# Patient Record
Sex: Male | Born: 1970 | Race: Black or African American | Hispanic: No | Marital: Single | Smoking: Current every day smoker
Health system: Southern US, Community
[De-identification: ages and names within clinical notes are randomized; demographics above are authoritative.]

## PROBLEM LIST (undated history)

## (undated) ENCOUNTER — Emergency Department (HOSPITAL_COMMUNITY): Admission: EM | Payer: PRIVATE HEALTH INSURANCE | Source: Home / Self Care

## (undated) DIAGNOSIS — I10 Essential (primary) hypertension: Secondary | ICD-10-CM

## (undated) SURGERY — Surgical Case
Anesthesia: *Unknown

---

## 2010-06-19 ENCOUNTER — Emergency Department (HOSPITAL_COMMUNITY)
Admission: EM | Admit: 2010-06-19 | Discharge: 2010-06-19 | Disposition: A | Discharge status: Home / Self Care | Payer: PRIVATE HEALTH INSURANCE | Attending: Emergency Medicine | Admitting: Emergency Medicine

## 2010-06-19 ENCOUNTER — Emergency Department (HOSPITAL_COMMUNITY): Payer: PRIVATE HEALTH INSURANCE

## 2010-06-19 DIAGNOSIS — R51 Headache: Secondary | ICD-10-CM | POA: Insufficient documentation

## 2010-06-19 DIAGNOSIS — R110 Nausea: Secondary | ICD-10-CM | POA: Insufficient documentation

## 2010-06-19 DIAGNOSIS — M79609 Pain in unspecified limb: Secondary | ICD-10-CM | POA: Insufficient documentation

## 2010-06-19 DIAGNOSIS — R0789 Other chest pain: Secondary | ICD-10-CM | POA: Insufficient documentation

## 2010-06-19 DIAGNOSIS — M25519 Pain in unspecified shoulder: Secondary | ICD-10-CM | POA: Insufficient documentation

## 2010-06-19 DIAGNOSIS — R002 Palpitations: Secondary | ICD-10-CM | POA: Insufficient documentation

## 2010-06-19 LAB — POCT CARDIAC MARKERS
CKMB, poc: <1 ng/mL — ABNORMAL LOW (ref 1.0–8.0)
Myoglobin, poc: 111 ng/mL (ref 12–200)

## 2010-06-19 LAB — COMPREHENSIVE METABOLIC PANEL
AST: 42 U/L — ABNORMAL HIGH (ref 0–37)
BUN: 8 mg/dL (ref 6–23)
CO2: 24 mEq/L (ref 19–32)
Chloride: 102 mEq/L (ref 96–112)
Creatinine, Ser: 1.04 mg/dL (ref 0.4–1.5)
GFR calc non Af Amer: >60 mL/min (ref >60)
Total Bilirubin: 0.7 mg/dL (ref 0.3–1.2)

## 2010-06-19 LAB — CBC
MCH: 31.8 pg (ref 26.0–34.0)
MCHC: 35.3 g/dL (ref 30.0–36.0)
Platelets: 192 10*3/uL (ref 150–400)
RDW: 12.4 % (ref 11.5–15.5)

## 2010-07-14 ENCOUNTER — Emergency Department (HOSPITAL_COMMUNITY)
Admission: EM | Admit: 2010-07-14 | Discharge: 2010-07-14 | Disposition: A | Discharge status: Home / Self Care | Payer: PRIVATE HEALTH INSURANCE | Attending: Emergency Medicine | Admitting: Emergency Medicine

## 2010-07-14 ENCOUNTER — Emergency Department (HOSPITAL_COMMUNITY): Payer: PRIVATE HEALTH INSURANCE

## 2010-07-14 DIAGNOSIS — F1010 Alcohol abuse, uncomplicated: Secondary | ICD-10-CM | POA: Insufficient documentation

## 2010-07-14 DIAGNOSIS — R209 Unspecified disturbances of skin sensation: Secondary | ICD-10-CM | POA: Insufficient documentation

## 2010-07-14 DIAGNOSIS — R0602 Shortness of breath: Secondary | ICD-10-CM | POA: Insufficient documentation

## 2010-07-14 DIAGNOSIS — R0789 Other chest pain: Secondary | ICD-10-CM | POA: Insufficient documentation

## 2010-07-14 LAB — CBC
HCT: 44.2 % (ref 39.0–52.0)
Hemoglobin: 15.5 g/dL (ref 13.0–17.0)
MCHC: 35.1 g/dL (ref 30.0–36.0)
RBC: 4.9 MIL/uL (ref 4.22–5.81)

## 2010-07-14 LAB — COMPREHENSIVE METABOLIC PANEL
ALT: 31 U/L (ref 0–53)
AST: 41 U/L — ABNORMAL HIGH (ref 0–37)
Albumin: 4.2 g/dL (ref 3.5–5.2)
Alkaline Phosphatase: 48 U/L (ref 39–117)
CO2: 28 mEq/L (ref 19–32)
Chloride: 101 mEq/L (ref 96–112)
Creatinine, Ser: 1.14 mg/dL (ref 0.4–1.5)
GFR calc Af Amer: >60 mL/min (ref >60)
GFR calc non Af Amer: >60 mL/min (ref >60)
Potassium: 4.2 mEq/L (ref 3.5–5.1)
Sodium: 136 mEq/L (ref 135–145)
Total Bilirubin: 0.7 mg/dL (ref 0.3–1.2)

## 2010-07-14 LAB — URINALYSIS, ROUTINE W REFLEX MICROSCOPIC
Bilirubin Urine: NEGATIVE
Hgb urine dipstick: NEGATIVE
Ketones, ur: NEGATIVE mg/dL
Nitrite: NEGATIVE
Protein, ur: NEGATIVE mg/dL
Urobilinogen, UA: 1 mg/dL (ref 0.0–1.0)

## 2010-07-14 LAB — RAPID URINE DRUG SCREEN, HOSP PERFORMED
Amphetamines: NOT DETECTED
Barbiturates: NOT DETECTED
Cocaine: NOT DETECTED
Opiates: NOT DETECTED
Tetrahydrocannabinol: NOT DETECTED

## 2010-07-14 LAB — DIFFERENTIAL
Basophils Absolute: 0.1 10*3/uL (ref 0.0–0.1)
Lymphocytes Relative: 26 % (ref 12–46)
Monocytes Absolute: 0.6 10*3/uL (ref 0.1–1.0)
Neutro Abs: 3 10*3/uL (ref 1.7–7.7)
Neutrophils Relative %: 59 % (ref 43–77)

## 2010-07-14 LAB — POCT CARDIAC MARKERS: CKMB, poc: 1 ng/mL (ref 1.0–8.0)

## 2011-12-06 IMAGING — CR DG CHEST 2V
2 series · 2 of 2 positions shown · non-contrast
Comparison: None.

CLINICAL DATA: Chest pain

CHEST - 2 VIEW

[view not recorded (1 of 2)]
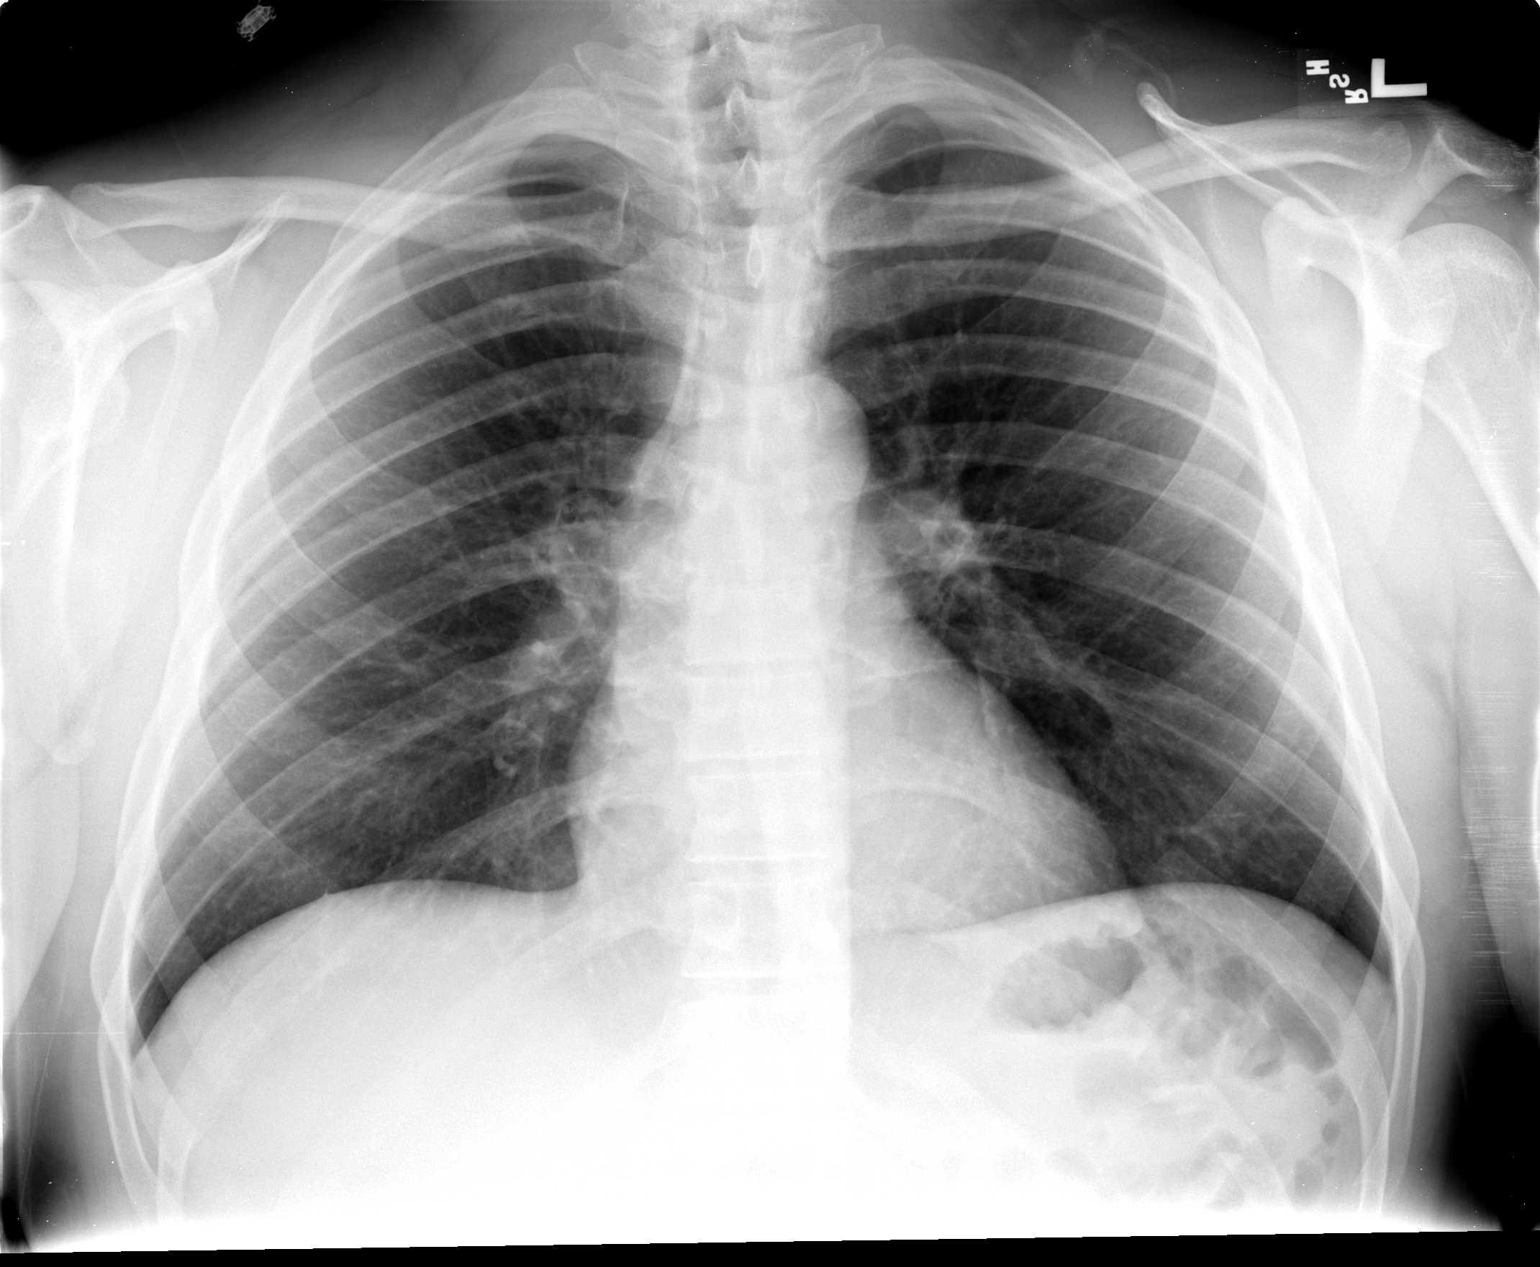

[view not recorded (2 of 2)]
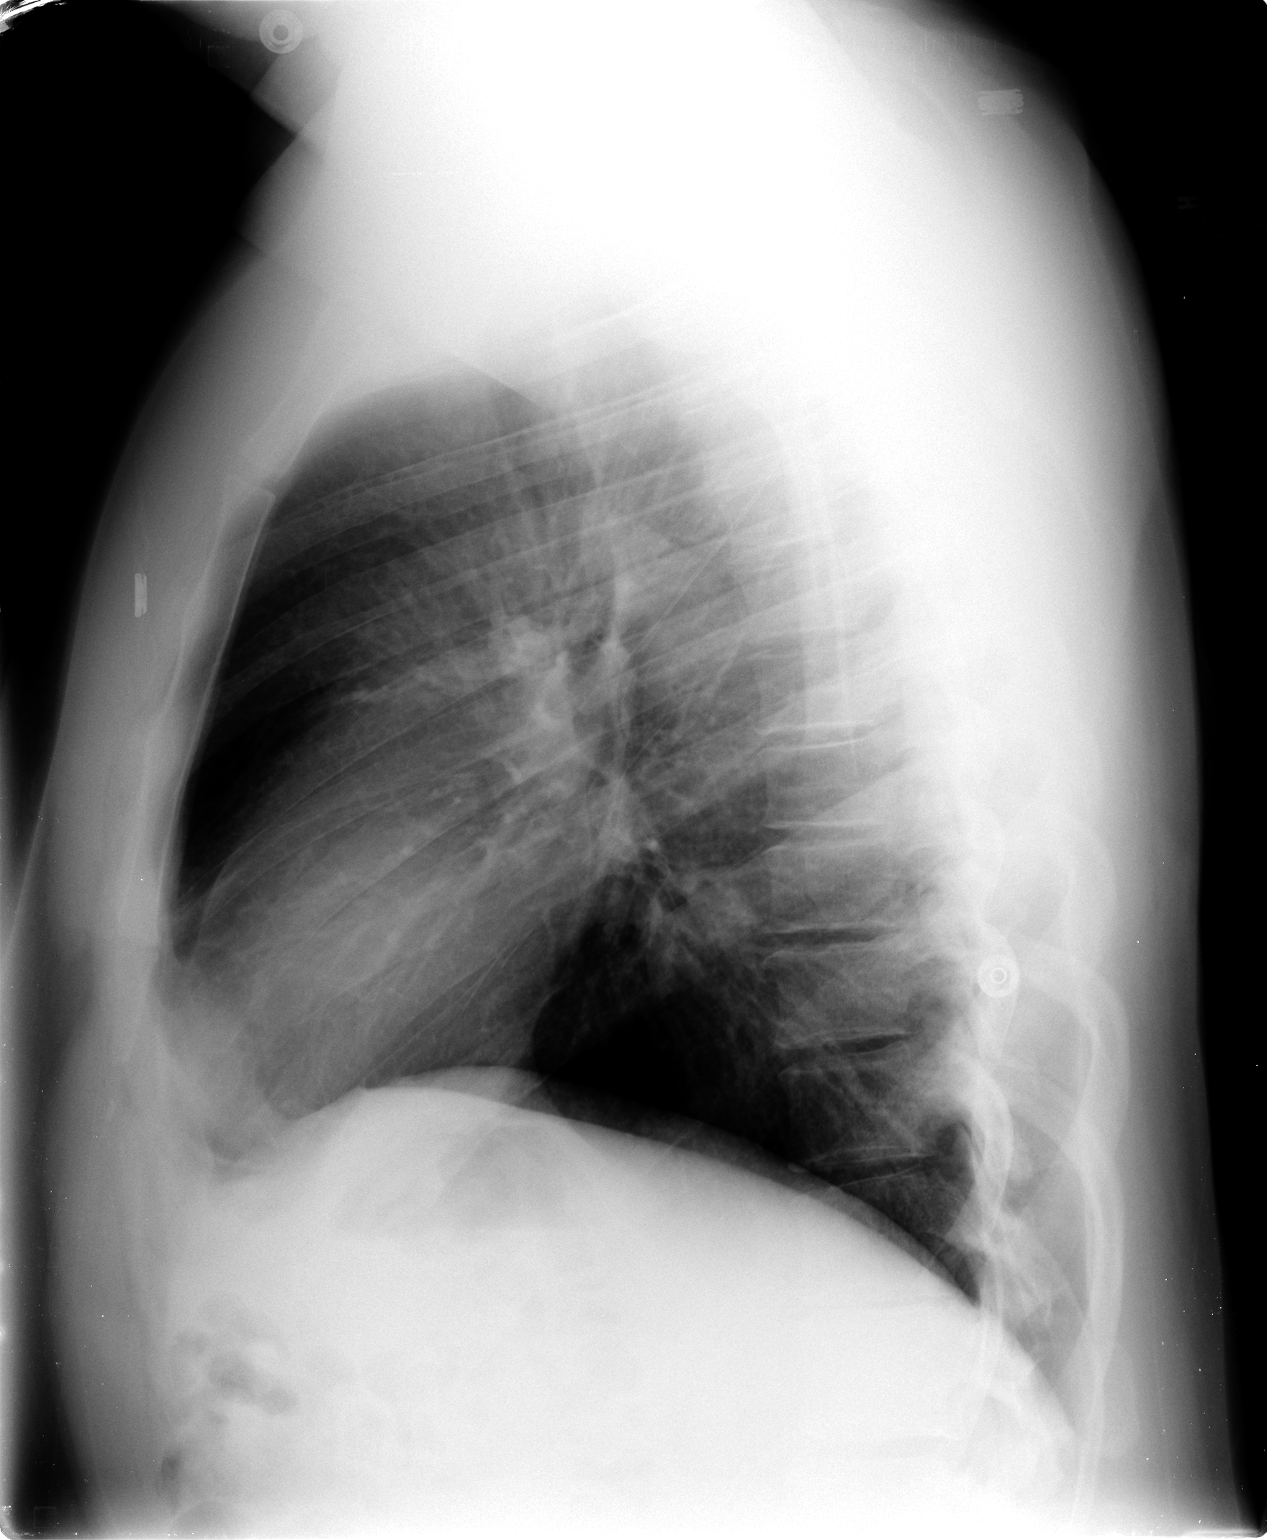

[2 of 2 positions shown; findings below may reference images not displayed]

FINDINGS: Mild central airway thickening without active airspace
disease or pleural fluid.  Heart  and vascularity normal.  Bony
structures intact.
IMPRESSION: Bronchitic type changes - no active disease.

## 2011-12-31 IMAGING — CR DG CHEST 2V
2 series · 2 of 2 positions shown · non-contrast
Comparison: 06/19/2010

CLINICAL DATA: Chest pain.

CHEST - 2 VIEW

[w chest pa]
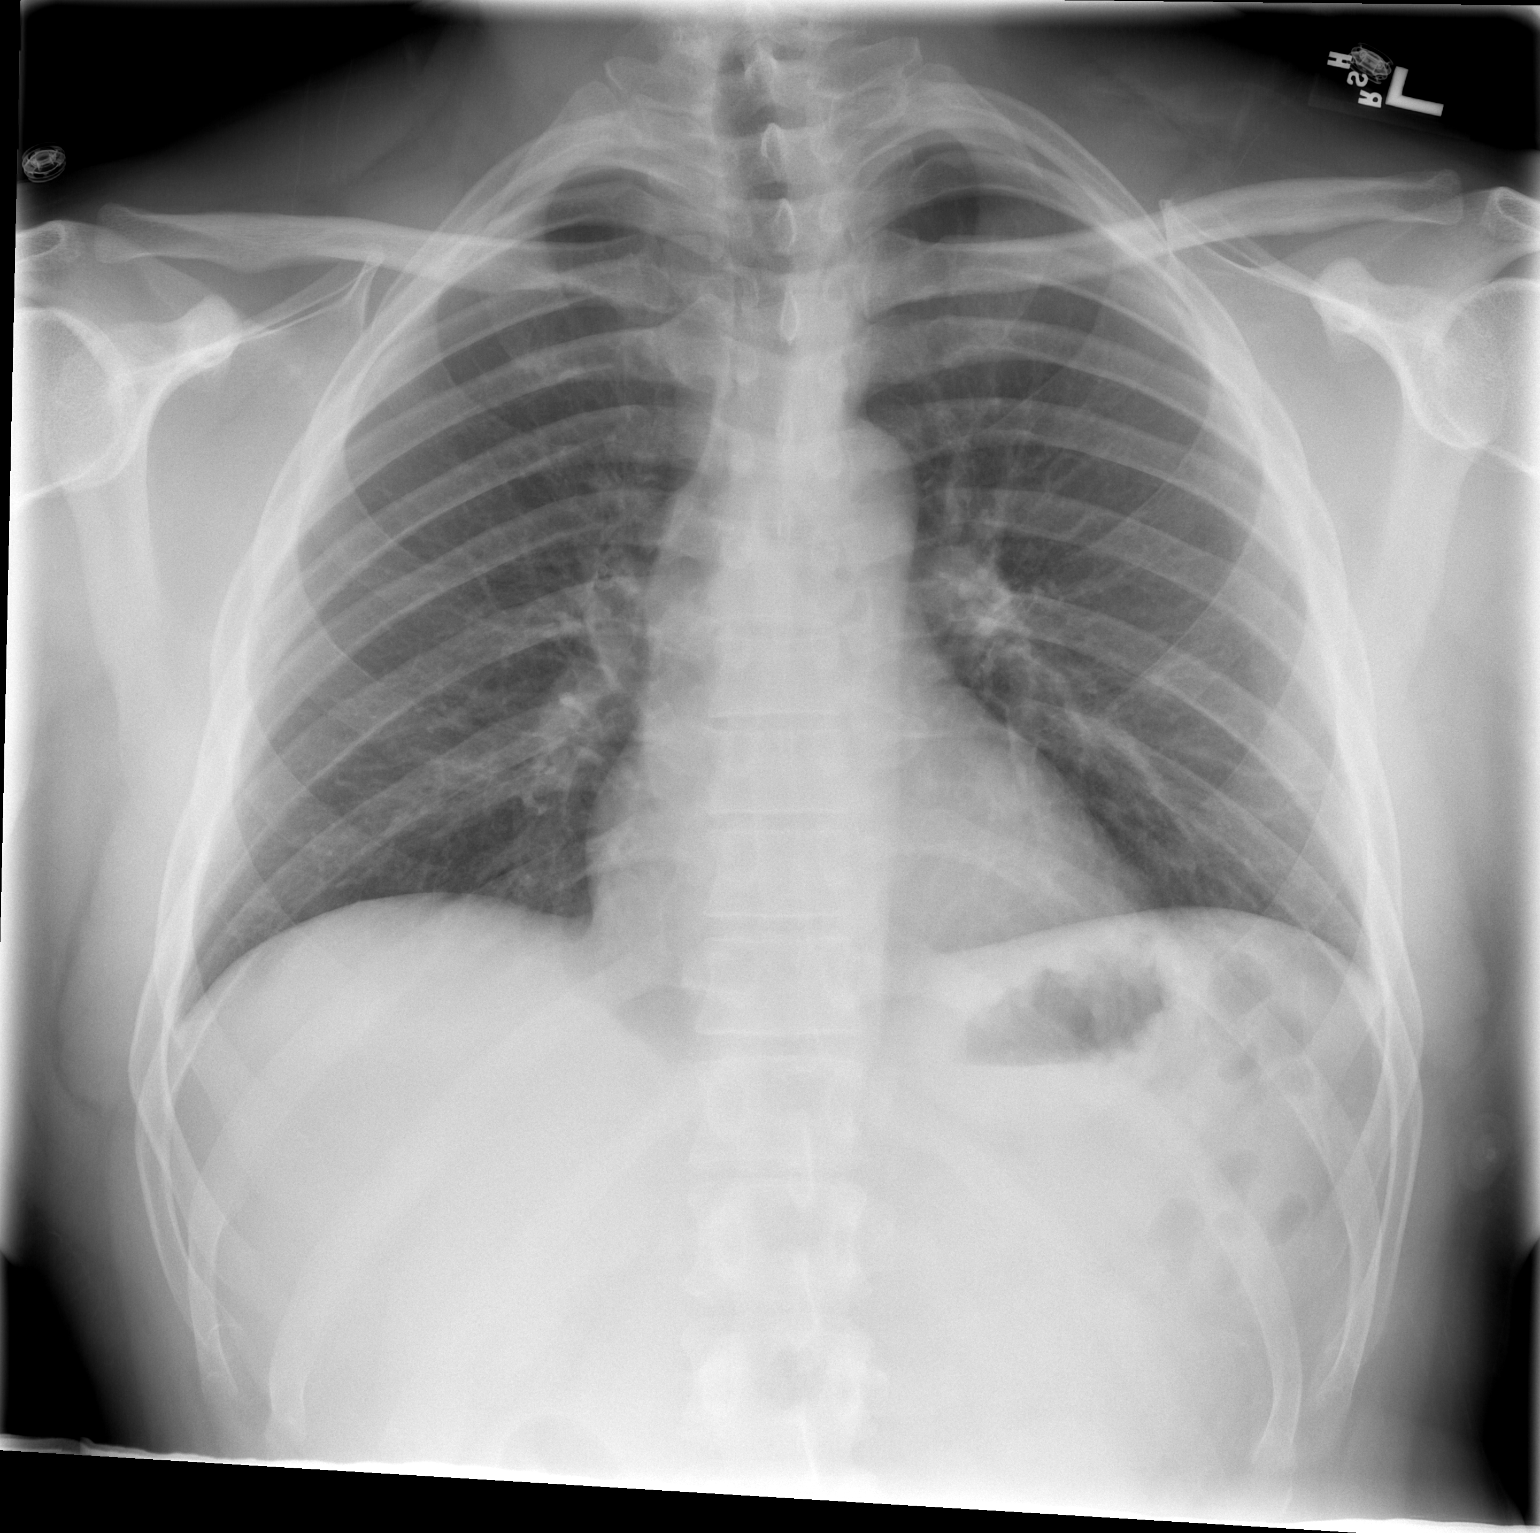

[w chest lat]
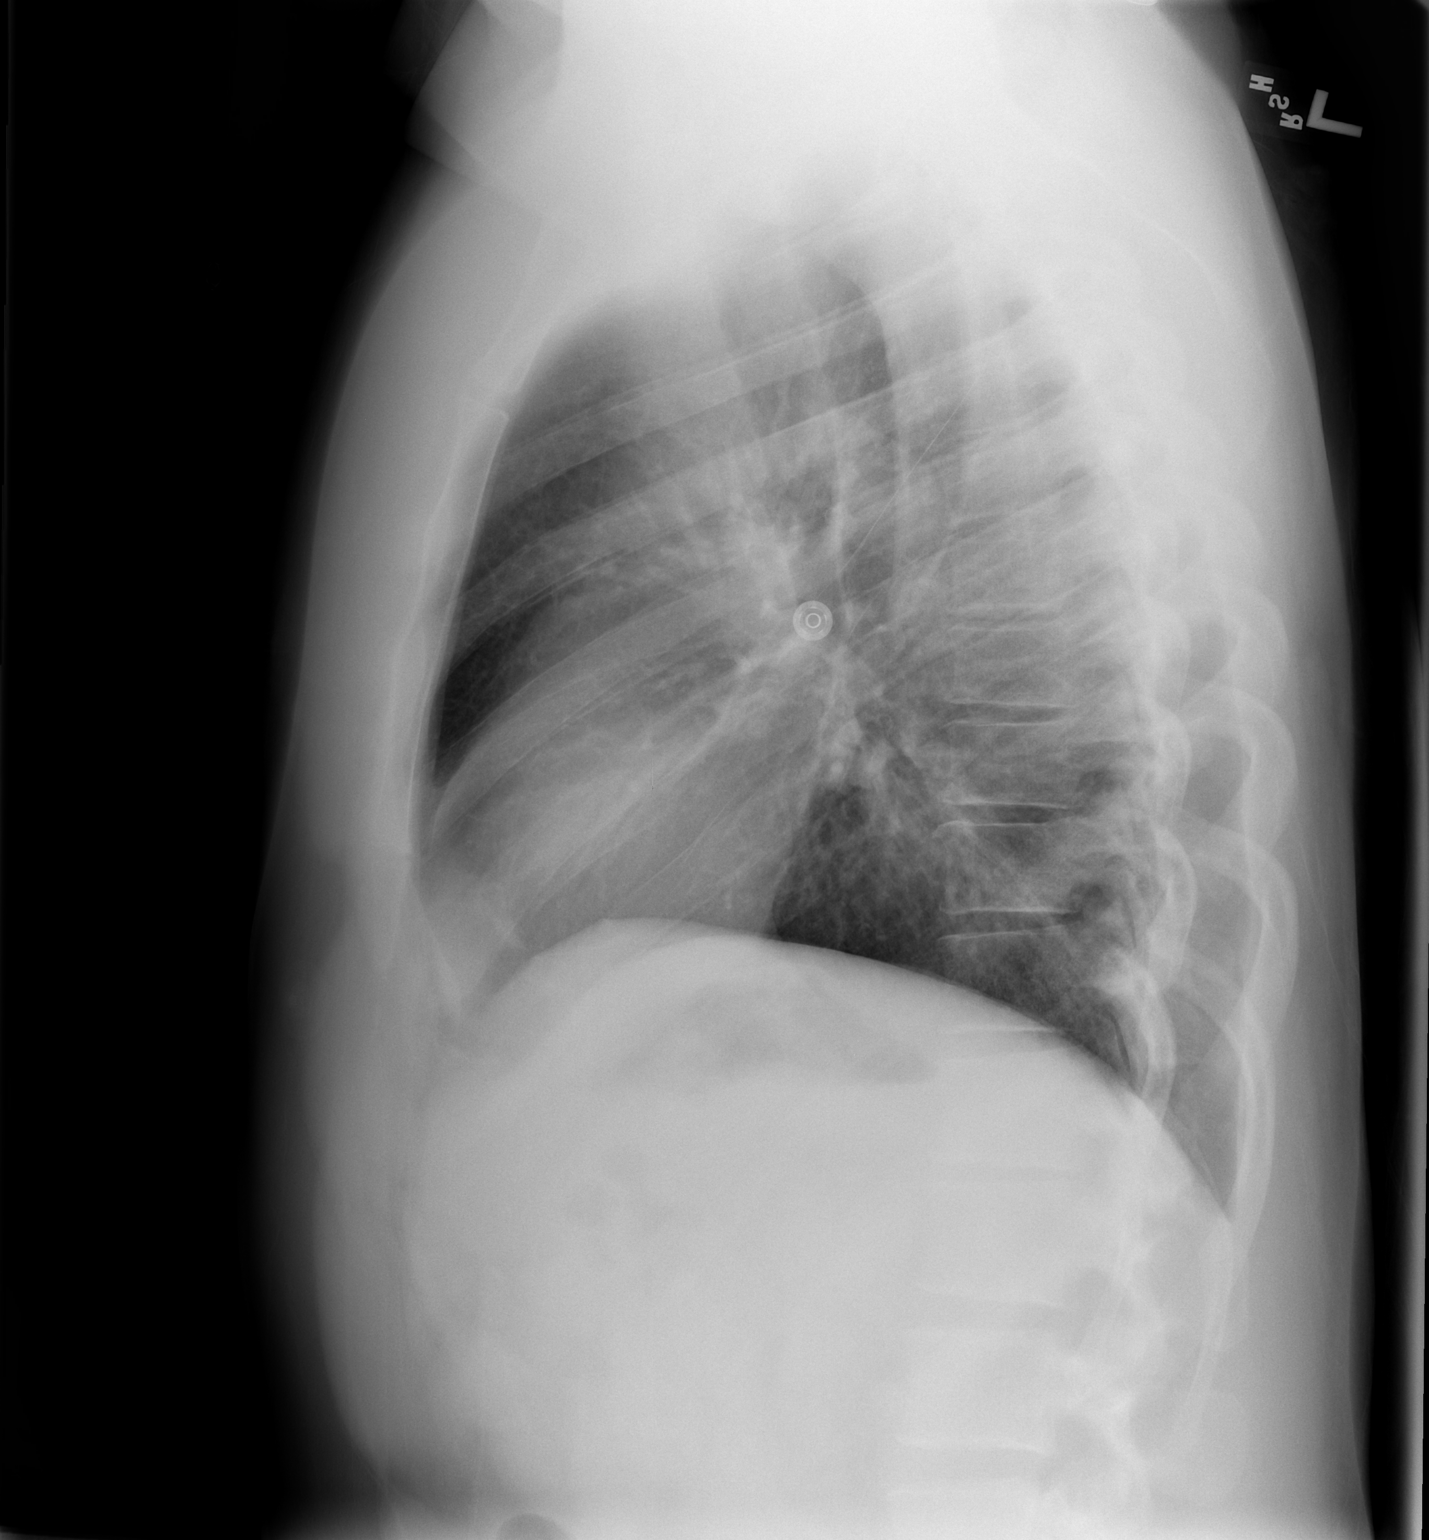

[2 of 2 positions shown; findings below may reference images not displayed]

FINDINGS: Heart and mediastinal contours are within normal limits.
No focal opacities or effusions.  No acute bony abnormality.
IMPRESSION: No active disease.

## 2018-01-23 ENCOUNTER — Other Ambulatory Visit: Payer: Self-pay

## 2018-01-23 ENCOUNTER — Encounter (HOSPITAL_COMMUNITY): Payer: Self-pay | Admitting: Obstetrics and Gynecology

## 2018-01-23 ENCOUNTER — Emergency Department (HOSPITAL_COMMUNITY)
Admission: EM | Admit: 2018-01-23 | Discharge: 2018-01-24 | Disposition: A | Discharge status: Home / Self Care | Payer: PRIVATE HEALTH INSURANCE | Attending: Emergency Medicine | Admitting: Emergency Medicine

## 2018-01-23 DIAGNOSIS — Y908 Blood alcohol level of 240 mg/100 ml or more: Secondary | ICD-10-CM | POA: Insufficient documentation

## 2018-01-23 DIAGNOSIS — F10920 Alcohol use, unspecified with intoxication, uncomplicated: Secondary | ICD-10-CM

## 2018-01-23 DIAGNOSIS — Z635 Disruption of family by separation and divorce: Secondary | ICD-10-CM | POA: Insufficient documentation

## 2018-01-23 DIAGNOSIS — R000 Tachycardia, unspecified: Secondary | ICD-10-CM | POA: Insufficient documentation

## 2018-01-23 DIAGNOSIS — F17210 Nicotine dependence, cigarettes, uncomplicated: Secondary | ICD-10-CM | POA: Insufficient documentation

## 2018-01-23 DIAGNOSIS — I10 Essential (primary) hypertension: Secondary | ICD-10-CM | POA: Insufficient documentation

## 2018-01-23 HISTORY — DX: Essential (primary) hypertension: I10

## 2018-01-23 LAB — COMPREHENSIVE METABOLIC PANEL
ALBUMIN: 4.6 g/dL (ref 3.5–5.0)
ALT: 51 U/L — ABNORMAL HIGH (ref 0–44)
ANION GAP: 17 — AB (ref 5–15)
AST: 71 U/L — ABNORMAL HIGH (ref 15–41)
Alkaline Phosphatase: 48 U/L (ref 38–126)
BUN: 11 mg/dL (ref 6–20)
CHLORIDE: 99 mmol/L (ref 98–111)
CO2: 19 mmol/L — AB (ref 22–32)
Calcium: 9.2 mg/dL (ref 8.9–10.3)
Creatinine, Ser: 0.96 mg/dL (ref 0.61–1.24)
GFR calc non Af Amer: >60 mL/min (ref >60)
GLUCOSE: 78 mg/dL (ref 70–99)
POTASSIUM: 3.9 mmol/L (ref 3.5–5.1)
SODIUM: 135 mmol/L (ref 135–145)
Total Bilirubin: 0.8 mg/dL (ref 0.3–1.2)
Total Protein: 9.4 g/dL — ABNORMAL HIGH (ref 6.5–8.1)

## 2018-01-23 LAB — CBC WITH DIFFERENTIAL/PLATELET
ABS IMMATURE GRANULOCYTES: 0.01 10*3/uL (ref 0.00–0.07)
BASOS PCT: 2 %
Basophils Absolute: 0.1 10*3/uL (ref 0.0–0.1)
EOS ABS: 0.1 10*3/uL (ref 0.0–0.5)
Eosinophils Relative: 1 %
HEMATOCRIT: 42.7 % (ref 39.0–52.0)
Hemoglobin: 14.5 g/dL (ref 13.0–17.0)
IMMATURE GRANULOCYTES: 0 %
LYMPHS ABS: 1.9 10*3/uL (ref 0.7–4.0)
Lymphocytes Relative: 46 %
MCH: 31 pg (ref 26.0–34.0)
MCHC: 34 g/dL (ref 30.0–36.0)
MCV: 91.4 fL (ref 80.0–100.0)
MONO ABS: 0.5 10*3/uL (ref 0.1–1.0)
Monocytes Relative: 12 %
Neutro Abs: 1.6 10*3/uL — ABNORMAL LOW (ref 1.7–7.7)
Neutrophils Relative %: 39 %
PLATELETS: 142 10*3/uL — AB (ref 150–400)
RBC: 4.67 MIL/uL (ref 4.22–5.81)
RDW: 13.1 % (ref 11.5–15.5)
WBC: 4 10*3/uL (ref 4.0–10.5)
nRBC: 0 % (ref 0.0–0.2)

## 2018-01-23 LAB — URINALYSIS, ROUTINE W REFLEX MICROSCOPIC
BACTERIA UA: NONE SEEN
Bilirubin Urine: NEGATIVE
Glucose, UA: NEGATIVE mg/dL
KETONES UR: NEGATIVE mg/dL
LEUKOCYTES UA: NEGATIVE
Nitrite: NEGATIVE
Protein, ur: 100 mg/dL — AB
SPECIFIC GRAVITY, URINE: 1.013 (ref 1.005–1.030)
pH: 5 (ref 5.0–8.0)

## 2018-01-23 LAB — RAPID URINE DRUG SCREEN, HOSP PERFORMED
Amphetamines: NOT DETECTED
BARBITURATES: NOT DETECTED
BENZODIAZEPINES: NOT DETECTED
Cocaine: NOT DETECTED
OPIATES: NOT DETECTED
TETRAHYDROCANNABINOL: NOT DETECTED

## 2018-01-23 LAB — SALICYLATE LEVEL: Salicylate Lvl: <7 mg/dL (ref 2.8–30.0)

## 2018-01-23 LAB — ETHANOL: Alcohol, Ethyl (B): 280 mg/dL — ABNORMAL HIGH (ref <10)

## 2018-01-23 LAB — ACETAMINOPHEN LEVEL

## 2018-01-23 MED ORDER — CHLORDIAZEPOXIDE HCL 25 MG PO CAPS: ORAL_CAPSULE | ORAL | 0 refills | Status: DC

## 2018-01-23 MED ORDER — CHLORDIAZEPOXIDE HCL 25 MG PO CAPS
50.0000 mg | ORAL_CAPSULE | Freq: Once | ORAL | Status: AC
  Administered 2018-01-23: 50 mg via ORAL
  Filled 2018-01-23: qty 2

## 2018-01-23 NOTE — ED Triage Notes (Signed)
Pt reports he and his wife of 18 years are going through a difficult divorce and he has been drinking heavily. Pt reports this has been going on for about 6 months and he has been drinking around 20 standard drinks a day some days and as little as 6 other days. 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

## 2018-01-23 NOTE — ED Notes (Signed)
Pt reports last drink was 2 hours ago

## 2018-01-23 NOTE — ED Notes (Signed)
EKG given to EDP,Knapp,J. MD., for review. 

## 2018-01-23 NOTE — Discharge Instructions (Addendum)
Thank you for allowing me to care for you today in the Emergency Department.   Use the attached resource guides to get established with an alcohol detoxification program in the community.   Take 50mg  of librium by mouth 3 times day for one day, then 25-50mg  by mouth two times daily for one day, then 25-50mg  by mouth once daily for one day.   Return to the Emergency Department if you have a seizure, if you pass out, if you have severe, persistent vomiting, if you develop severe tremors, chest pain, or other new, concerning symptoms.

## 2018-01-24 NOTE — ED Provider Notes (Signed)
[facility]-EMERGENCY DEPT Provider Note   CSN: [id number] Arrival date & time: 01/23/18  1909     History   Chief Complaint Chief Complaint  Patient presents with  . Alcohol Problem    HPI Christian Sawyer is a 47 y.o. male with a h/o of HTN who presents to the emergency department from home with a chief complaint of alcohol intoxication.  The patient reports drinking beer heavily over the last 10 to 11 months.  He reports that he and his wife of 18 years are going through divorce.  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  He states that he has tried to cut back to drinking only 6-8 beers daily over the last few months.  He reports that he has had 512 ounce beers today.  Last drink was 2-1/2 hours prior to arrival.  He states that over the last few months that he is gone anywhere from 24 hours to 2 days without drinking alcohol.  No history of DTs or seizures from alcohol withdrawal.  He states that he wants to stop drinking and wants assistance with getting into a program.  He denies SI, HI, or auditory visual hallucinations.  He states that he has to live for himself and has 2 adult daughters who are the most important people in his life.  Reports he has had intermittent, infrequent episodes of nausea and nonbloody, nonbilious emesis over the last few weeks.  None today.  He denies chest pain, dyspnea, abdominal pain, fever, chills, or confusion.  No headache or palpitations.   The history is provided by the patient. No language interpreter was used.    Past Medical History:  Diagnosis Date  . Hypertension     There are no active problems to display for this patient.   History reviewed. No pertinent surgical history.      Home Medications    Prior to Admission medications   Medication Sig Start Date End Date Taking? Authorizing Provider  chlordiazePOXIDE (LIBRIUM) 25 MG capsule 50mg   PO TID x 1D, then 25-50mg  PO BID X 1D, then 25-50mg  PO QD X 1D 01/23/18   Christian Sawyer A, PA-C    Family History History reviewed. No pertinent family history.  Social History Social History   Tobacco Use  . Smoking status: Current Every Day Smoker    Packs/day: 0.33    Years: 30.00    Pack years: 9.90    Types: Cigarettes  . Smokeless tobacco: Never Used  Substance Use Topics  . Alcohol use: Yes    Alcohol/week: 50.0 standard drinks    Types: 50 Standard drinks or equivalent per week  . Drug use: Not Currently     Allergies   Patient has no known allergies.   Review of Systems Review of Systems  Constitutional: Negative for appetite change and fever.  Respiratory: Negative for shortness of breath.   Cardiovascular: Negative for chest pain.  Gastrointestinal: Positive for nausea and vomiting. Negative for abdominal pain.  Genitourinary: Negative for dysuria.  Musculoskeletal: Negative for back pain.  Skin: Negative for rash.  Allergic/Immunologic: Negative for immunocompromised state.  Neurological: Negative for dizziness, weakness and headaches.  Psychiatric/Behavioral: Negative for agitation, confusion, hallucinations, self-injury and suicidal ideas.     Physical Exam Updated Vital Signs BP (!) 144/90 (BP Location: Right Arm)   Pulse (!) 119   Temp 98.6 F (37 C) (Oral)   Resp (!) 25  Ht 5\' 9"  (1.753 m)   Wt 92.1 kg   SpO2 98%   BMI 29.98 kg/m   Physical Exam  Constitutional: He appears well-developed. No distress.  Well appearing.  HENT:  Head: Normocephalic.  Eyes: Pupils are equal, round, and reactive to light. Conjunctivae and EOM are normal. No scleral icterus.  Neck: Neck supple. No JVD present.  Cardiovascular: Regular rhythm, normal heart sounds and intact distal pulses. Tachycardia present. Exam reveals no gallop and no friction rub.  No murmur heard. Pulmonary/Chest: Effort normal. No stridor. No respiratory distress. He has no wheezes.  He has no rales. He exhibits no tenderness.  Abdominal: Soft. He exhibits no distension and no mass. There is no tenderness. There is no rebound and no guarding. No hernia.  Lymphadenopathy:    He has no cervical adenopathy.  Neurological: He is alert.  ANO x4.  No tremor noted.  Skin: Skin is warm and dry. He is not diaphoretic.  Psychiatric: He has a normal mood and affect. His speech is normal and behavior is normal. Judgment and thought content normal. He is not actively hallucinating. Cognition and memory are normal. He expresses no homicidal and no suicidal ideation. He expresses no suicidal plans and no homicidal plans.  No agitation.  Nursing note and vitals reviewed.  ED Treatments / Results  Labs (all labs ordered are listed, but only abnormal results are displayed) Labs Reviewed  COMPREHENSIVE METABOLIC PANEL - Abnormal; Notable for the following components:      Result Value   CO2 19 (*)    Total Protein 9.4 (*)    AST 71 (*)    ALT 51 (*)    Anion gap 17 (*)    All other components within normal limits  ETHANOL - Abnormal; Notable for the following components:   Alcohol, Ethyl (B) 280 (*)    All other components within normal limits  CBC WITH DIFFERENTIAL/PLATELET - Abnormal; Notable for the following components:   Platelets 142 (*)    Neutro Abs 1.6 (*)    All other components within normal limits  URINALYSIS, ROUTINE W REFLEX MICROSCOPIC - Abnormal; Notable for the following components:   Hgb urine dipstick SMALL (*)    Protein, ur 100 (*)    All other components within normal limits  ACETAMINOPHEN LEVEL - Abnormal; Notable for the following components:   Acetaminophen (Tylenol), Serum <10 (*)    All other components within normal limits  RAPID URINE DRUG SCREEN, HOSP PERFORMED  SALICYLATE LEVEL    EKG None  Radiology No results found.  Procedures Procedures (including critical care time)  Medications Ordered in ED Medications  chlordiazePOXIDE  (LIBRIUM) capsule 50 mg (50 mg Oral Given 01/23/18 2337)     Initial Impression / Assessment and Plan / ED Course  I have reviewed the triage vital signs and the nursing notes.  Pertinent labs & imaging results that were available during my care of the patient were reviewed by me and considered in my medical decision making (see chart for details).  Clinical Course as of Jan 24 253  Thu Jan 23, 2018  2123 EKG sinus tach rate of 118 poor R wave progression anteriorly possible old anterior MI nonspecific ST-T changes no prior to compare with.   [MB]    Clinical Course User Index [MB] Terrilee Files, MD    47 year old male with a history of hypertension presenting with alcohol intoxication.  He wants assistance with abstinence from alcohol.  Last drink was  2.5 hours prior to arrival.  He drinks 512 ounce beers earlier today.  He denies SI, HI, or auditory visual hallucinations.  Tachycardic in the 130s on arrival with BP of 167/129.  EKG with sinus tachycardia at 118 and poor R wave progression.  Patient denies chest pain or dyspnea at this time.  UDS is unremarkable.  Mild AST and ALT elevation at 71 and 51 respectively.  Ethanol is 280.  Reevaluation, patient's heart rate has decreased from 130s to 100-105 on the cardiac monitor. He reports that he is a current every day smoker.  He denies IV recreational drug use.  Doubt GI bleed or ACS.  Initial CIWA is noted to be 12, based on nausea and vomiting, tremor, anxiety, and agitation.  However, my exam, the patient is not tremulous at this time.  He has denied nausea and vomiting today.  The patient was discussed with Dr. Charm Barges, attending physician.  He has no history of complicated alcohol withdrawal and reports having gone multiple days over the last year with abstinence from alcohol.  Will give the patient first dose of Librium in the ED and discharge him home with a Librium taper and outpatient and inpatient community resources for  alcohol cessation.  He is not ill-appearing.  He is in no acute distress.  He was given strict return precautions to the emergency department for new or worsening symptoms.  He is hemodynamically stable and is safe for discharge home with outpatient follow-up at this time.   Final Clinical Impressions(s) / ED Diagnoses   Final diagnoses:  Alcoholic intoxication without complication [facility])    ED Discharge Orders         Ordered    chlordiazePOXIDE (LIBRIUM) 25 MG capsule     01/23/18 2231           Lexton Hidalgo A, PA-C 01/24/18 0303    Terrilee Files, MD 01/24/18 1141

## 2021-12-30 ENCOUNTER — Other Ambulatory Visit: Payer: Self-pay

## 2021-12-30 ENCOUNTER — Encounter (HOSPITAL_COMMUNITY): Payer: Self-pay | Admitting: Emergency Medicine

## 2021-12-30 ENCOUNTER — Inpatient Hospital Stay (HOSPITAL_COMMUNITY)
Admission: EM | Admit: 2021-12-30 | Discharge: 2022-01-01 | DRG: 357 | Discharge status: Against Medical Advice | Payer: BLUE CROSS/BLUE SHIELD | Attending: Surgery | Admitting: Surgery

## 2021-12-30 DIAGNOSIS — R112 Nausea with vomiting, unspecified: Secondary | ICD-10-CM | POA: Diagnosis present

## 2021-12-30 DIAGNOSIS — S9001XA Contusion of right ankle, initial encounter: Secondary | ICD-10-CM | POA: Diagnosis present

## 2021-12-30 DIAGNOSIS — E872 Acidosis, unspecified: Secondary | ICD-10-CM | POA: Diagnosis present

## 2021-12-30 DIAGNOSIS — E871 Hypo-osmolality and hyponatremia: Secondary | ICD-10-CM | POA: Diagnosis present

## 2021-12-30 DIAGNOSIS — I10 Essential (primary) hypertension: Secondary | ICD-10-CM | POA: Diagnosis present

## 2021-12-30 DIAGNOSIS — R451 Restlessness and agitation: Secondary | ICD-10-CM | POA: Diagnosis not present

## 2021-12-30 DIAGNOSIS — R935 Abnormal findings on diagnostic imaging of other abdominal regions, including retroperitoneum: Principal | ICD-10-CM | POA: Diagnosis present

## 2021-12-30 DIAGNOSIS — Z5329 Procedure and treatment not carried out because of patient's decision for other reasons: Secondary | ICD-10-CM | POA: Diagnosis not present

## 2021-12-30 DIAGNOSIS — F17210 Nicotine dependence, cigarettes, uncomplicated: Secondary | ICD-10-CM | POA: Diagnosis present

## 2021-12-30 DIAGNOSIS — Y908 Blood alcohol level of 240 mg/100 ml or more: Secondary | ICD-10-CM | POA: Diagnosis present

## 2021-12-30 DIAGNOSIS — S93401A Sprain of unspecified ligament of right ankle, initial encounter: Secondary | ICD-10-CM | POA: Diagnosis present

## 2021-12-30 DIAGNOSIS — R7402 Elevation of levels of lactic acid dehydrogenase (LDH): Secondary | ICD-10-CM | POA: Diagnosis present

## 2021-12-30 DIAGNOSIS — Y92410 Unspecified street and highway as the place of occurrence of the external cause: Secondary | ICD-10-CM

## 2021-12-30 DIAGNOSIS — F10129 Alcohol abuse with intoxication, unspecified: Secondary | ICD-10-CM | POA: Diagnosis present

## 2021-12-30 LAB — CBC WITH DIFFERENTIAL/PLATELET
Abs Immature Granulocytes: 0 10*3/uL (ref 0.00–0.07)
Basophils Absolute: 0.2 10*3/uL — ABNORMAL HIGH (ref 0.0–0.1)
Basophils Relative: 5 %
Eosinophils Absolute: 0 10*3/uL (ref 0.0–0.5)
Eosinophils Relative: 0 %
HCT: 33 % — ABNORMAL LOW (ref 39.0–52.0)
Hemoglobin: 12 g/dL — ABNORMAL LOW (ref 13.0–17.0)
Lymphocytes Relative: 48 %
Lymphs Abs: 1.6 10*3/uL (ref 0.7–4.0)
MCH: 33.1 pg (ref 26.0–34.0)
MCHC: 36.4 g/dL — ABNORMAL HIGH (ref 30.0–36.0)
MCV: 91.2 fL (ref 80.0–100.0)
Monocytes Absolute: 0.3 10*3/uL (ref 0.1–1.0)
Monocytes Relative: 8 %
Neutro Abs: 1.3 10*3/uL — ABNORMAL LOW (ref 1.7–7.7)
Neutrophils Relative %: 39 %
Platelets: 133 10*3/uL — ABNORMAL LOW (ref 150–400)
RBC: 3.62 MIL/uL — ABNORMAL LOW (ref 4.22–5.81)
RDW: 12.6 % (ref 11.5–15.5)
WBC: 3.3 10*3/uL — ABNORMAL LOW (ref 4.0–10.5)
nRBC: 0 % (ref 0.0–0.2)

## 2021-12-30 LAB — I-STAT CHEM 8, ED
BUN: 6 mg/dL (ref 6–20)
Calcium, Ion: 0.91 mmol/L — ABNORMAL LOW (ref 1.15–1.40)
Chloride: 93 mmol/L — ABNORMAL LOW (ref 98–111)
Creatinine, Ser: 1.4 mg/dL — ABNORMAL HIGH (ref 0.61–1.24)
Glucose, Bld: 95 mg/dL (ref 70–99)
HCT: 37 % — ABNORMAL LOW (ref 39.0–52.0)
Hemoglobin: 12.6 g/dL — ABNORMAL LOW (ref 13.0–17.0)
Potassium: 3.6 mmol/L (ref 3.5–5.1)
Sodium: 124 mmol/L — ABNORMAL LOW (ref 135–145)
TCO2: 17 mmol/L — ABNORMAL LOW (ref 22–32)

## 2021-12-30 LAB — COMPREHENSIVE METABOLIC PANEL
ALT: 45 U/L — ABNORMAL HIGH (ref 0–44)
AST: 94 U/L — ABNORMAL HIGH (ref 15–41)
Albumin: 3.8 g/dL (ref 3.5–5.0)
Alkaline Phosphatase: 35 U/L — ABNORMAL LOW (ref 38–126)
Anion gap: 16 — ABNORMAL HIGH (ref 5–15)
BUN: 5 mg/dL — ABNORMAL LOW (ref 6–20)
CO2: 17 mmol/L — ABNORMAL LOW (ref 22–32)
Calcium: 8.5 mg/dL — ABNORMAL LOW (ref 8.9–10.3)
Chloride: 91 mmol/L — ABNORMAL LOW (ref 98–111)
Creatinine, Ser: 1.02 mg/dL (ref 0.61–1.24)
GFR, Estimated: >60 mL/min (ref >60)
Glucose, Bld: 98 mg/dL (ref 70–99)
Potassium: 3.5 mmol/L (ref 3.5–5.1)
Sodium: 124 mmol/L — ABNORMAL LOW (ref 135–145)
Total Bilirubin: 0.9 mg/dL (ref 0.3–1.2)
Total Protein: 7.3 g/dL (ref 6.5–8.1)

## 2021-12-30 LAB — LACTIC ACID, PLASMA
Lactic Acid, Venous: 3.9 mmol/L (ref 0.5–1.9)
Lactic Acid, Venous: 3.7 mmol/L (ref 0.5–1.9)

## 2021-12-30 LAB — ETHANOL: Alcohol, Ethyl (B): 278 mg/dL — ABNORMAL HIGH (ref <10)

## 2021-12-30 MED ORDER — SODIUM CHLORIDE 0.9 % IV BOLUS
1000.0000 mL | Freq: Once | INTRAVENOUS | Status: AC
  Administered 2021-12-31: 1000 mL via INTRAVENOUS

## 2021-12-30 MED ORDER — LACTATED RINGERS IV BOLUS
1000.0000 mL | Freq: Once | INTRAVENOUS | Status: AC
  Administered 2021-12-30: 1000 mL via INTRAVENOUS
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
## 2021-12-30 NOTE — ED Notes (Signed)
Pt refusing fluids

## 2021-12-30 NOTE — ED Notes (Signed)
Asked pt for permission to take X-ray, once again, pt denied.

## 2021-12-30 NOTE — ED Notes (Signed)
Pt incontinent before arrival. Pt refused for staff to help change him.
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
## 2021-12-31 ENCOUNTER — Inpatient Hospital Stay (HOSPITAL_COMMUNITY): Payer: BLUE CROSS/BLUE SHIELD | Admitting: Certified Registered Nurse Anesthetist

## 2021-12-31 ENCOUNTER — Other Ambulatory Visit: Payer: Self-pay

## 2021-12-31 ENCOUNTER — Emergency Department (HOSPITAL_COMMUNITY): Payer: BLUE CROSS/BLUE SHIELD

## 2021-12-31 ENCOUNTER — Observation Stay (HOSPITAL_COMMUNITY): Payer: BLUE CROSS/BLUE SHIELD

## 2021-12-31 ENCOUNTER — Encounter (HOSPITAL_COMMUNITY): Payer: Self-pay

## 2021-12-31 ENCOUNTER — Encounter (HOSPITAL_COMMUNITY): Admission: EM | Discharge status: Against Medical Advice | Payer: Self-pay | Source: Home / Self Care

## 2021-12-31 DIAGNOSIS — R451 Restlessness and agitation: Secondary | ICD-10-CM | POA: Diagnosis not present

## 2021-12-31 DIAGNOSIS — R112 Nausea with vomiting, unspecified: Secondary | ICD-10-CM | POA: Diagnosis present

## 2021-12-31 DIAGNOSIS — E872 Acidosis, unspecified: Secondary | ICD-10-CM | POA: Diagnosis present

## 2021-12-31 DIAGNOSIS — S9001XA Contusion of right ankle, initial encounter: Secondary | ICD-10-CM | POA: Diagnosis present

## 2021-12-31 DIAGNOSIS — R935 Abnormal findings on diagnostic imaging of other abdominal regions, including retroperitoneum: Secondary | ICD-10-CM | POA: Diagnosis present

## 2021-12-31 DIAGNOSIS — F17210 Nicotine dependence, cigarettes, uncomplicated: Secondary | ICD-10-CM | POA: Diagnosis present

## 2021-12-31 DIAGNOSIS — Y92410 Unspecified street and highway as the place of occurrence of the external cause: Secondary | ICD-10-CM | POA: Diagnosis not present

## 2021-12-31 DIAGNOSIS — S93401A Sprain of unspecified ligament of right ankle, initial encounter: Secondary | ICD-10-CM | POA: Diagnosis present

## 2021-12-31 DIAGNOSIS — I10 Essential (primary) hypertension: Secondary | ICD-10-CM | POA: Diagnosis present

## 2021-12-31 DIAGNOSIS — E871 Hypo-osmolality and hyponatremia: Secondary | ICD-10-CM | POA: Diagnosis present

## 2021-12-31 DIAGNOSIS — R7402 Elevation of levels of lactic acid dehydrogenase (LDH): Secondary | ICD-10-CM | POA: Diagnosis present

## 2021-12-31 DIAGNOSIS — Y908 Blood alcohol level of 240 mg/100 ml or more: Secondary | ICD-10-CM | POA: Diagnosis present

## 2021-12-31 DIAGNOSIS — Z5329 Procedure and treatment not carried out because of patient's decision for other reasons: Secondary | ICD-10-CM | POA: Diagnosis not present

## 2021-12-31 DIAGNOSIS — F10129 Alcohol abuse with intoxication, unspecified: Secondary | ICD-10-CM | POA: Diagnosis present

## 2021-12-31 HISTORY — PX: LAPAROSCOPY: SHX197

## 2021-12-31 LAB — BASIC METABOLIC PANEL
Anion gap: 15 (ref 5–15)
BUN: 7 mg/dL (ref 6–20)
CO2: 22 mmol/L (ref 22–32)
Calcium: 8.7 mg/dL — ABNORMAL LOW (ref 8.9–10.3)
Chloride: 92 mmol/L — ABNORMAL LOW (ref 98–111)
Creatinine, Ser: 0.85 mg/dL (ref 0.61–1.24)
GFR, Estimated: >60 mL/min (ref >60)
Glucose, Bld: 97 mg/dL (ref 70–99)
Potassium: 4.3 mmol/L (ref 3.5–5.1)
Sodium: 129 mmol/L — ABNORMAL LOW (ref 135–145)

## 2021-12-31 LAB — CBC
HCT: 31.6 % — ABNORMAL LOW (ref 39.0–52.0)
Hemoglobin: 11.8 g/dL — ABNORMAL LOW (ref 13.0–17.0)
MCH: 33.4 pg (ref 26.0–34.0)
MCHC: 37.3 g/dL — ABNORMAL HIGH (ref 30.0–36.0)
MCV: 89.5 fL (ref 80.0–100.0)
Platelets: 105 10*3/uL — ABNORMAL LOW (ref 150–400)
RBC: 3.53 MIL/uL — ABNORMAL LOW (ref 4.22–5.81)
RDW: 12.8 % (ref 11.5–15.5)
WBC: 3.5 10*3/uL — ABNORMAL LOW (ref 4.0–10.5)
nRBC: 0 % (ref 0.0–0.2)

## 2021-12-31 LAB — HIV ANTIBODY (ROUTINE TESTING W REFLEX): HIV Screen 4th Generation wRfx: NONREACTIVE

## 2021-12-31 LAB — RAPID URINE DRUG SCREEN, HOSP PERFORMED
Amphetamines: NOT DETECTED
Barbiturates: NOT DETECTED
Benzodiazepines: NOT DETECTED
Cocaine: NOT DETECTED
Opiates: NOT DETECTED
Tetrahydrocannabinol: NOT DETECTED

## 2021-12-31 LAB — LACTIC ACID, PLASMA
Lactic Acid, Venous: 5 mmol/L (ref 0.5–1.9)
Lactic Acid, Venous: 2.5 mmol/L (ref 0.5–1.9)

## 2021-12-31 SURGERY — LAPAROSCOPY, DIAGNOSTIC
Anesthesia: General | Site: Abdomen

## 2021-12-31 MED ORDER — CHLORHEXIDINE GLUCONATE CLOTH 2 % EX PADS
6.0000 | MEDICATED_PAD | Freq: Once | CUTANEOUS | Status: DC
  Administered 2021-12-31: 6 via TOPICAL

## 2021-12-31 MED ORDER — LACTATED RINGERS IV SOLN: INTRAVENOUS | Status: DC

## 2021-12-31 MED ORDER — ACETAMINOPHEN 10 MG/ML IV SOLN: 1000.0000 mg | Freq: Once | INTRAVENOUS | Status: DC | PRN

## 2021-12-31 MED ORDER — FENTANYL CITRATE (PF) 250 MCG/5ML IJ SOLN
INTRAMUSCULAR | Status: DC | PRN
  Administered 2021-12-31: 150 ug via INTRAVENOUS
  Administered 2021-12-31 (×2): 50 ug via INTRAVENOUS

## 2021-12-31 MED ORDER — PROMETHAZINE HCL 25 MG/ML IJ SOLN: 6.2500 mg | INTRAMUSCULAR | Status: DC | PRN

## 2021-12-31 MED ORDER — LIDOCAINE 2% (20 MG/ML) 5 ML SYRINGE
INTRAMUSCULAR | Status: AC
  Filled 2021-12-31: qty 5

## 2021-12-31 MED ORDER — PROCHLORPERAZINE EDISYLATE 10 MG/2ML IJ SOLN: 10.0000 mg | Freq: Four times a day (QID) | INTRAMUSCULAR | Status: DC | PRN

## 2021-12-31 MED ORDER — DEXAMETHASONE SODIUM PHOSPHATE 10 MG/ML IJ SOLN
INTRAMUSCULAR | Status: DC | PRN
  Administered 2021-12-31: 5 mg via INTRAVENOUS

## 2021-12-31 MED ORDER — AMISULPRIDE (ANTIEMETIC) 5 MG/2ML IV SOLN: 10.0000 mg | Freq: Once | INTRAVENOUS | Status: DC | PRN

## 2021-12-31 MED ORDER — MIDAZOLAM HCL 2 MG/2ML IJ SOLN
INTRAMUSCULAR | Status: DC | PRN
  Administered 2021-12-31: 2 mg via INTRAVENOUS

## 2021-12-31 MED ORDER — ALBUMIN HUMAN 5 % IV SOLN: INTRAVENOUS | Status: DC | PRN

## 2021-12-31 MED ORDER — ENOXAPARIN SODIUM 30 MG/0.3ML IJ SOSY
30.0000 mg | PREFILLED_SYRINGE | Freq: Two times a day (BID) | INTRAMUSCULAR | Status: DC
  Administered 2022-01-01: 30 mg via SUBCUTANEOUS
  Filled 2021-12-31: qty 0.3

## 2021-12-31 MED ORDER — LORAZEPAM 1 MG PO TABS
1.0000 mg | ORAL_TABLET | ORAL | Status: DC | PRN
  Administered 2021-12-31: 1 mg via ORAL
  Filled 2021-12-31: qty 1

## 2021-12-31 MED ORDER — ROCURONIUM BROMIDE 10 MG/ML (PF) SYRINGE
PREFILLED_SYRINGE | INTRAVENOUS | Status: DC | PRN
  Administered 2021-12-31: 50 mg via INTRAVENOUS

## 2021-12-31 MED ORDER — OXYCODONE HCL 5 MG/5ML PO SOLN: 5.0000 mg | Freq: Once | ORAL | Status: DC | PRN

## 2021-12-31 MED ORDER — ONDANSETRON HCL 4 MG/2ML IJ SOLN
INTRAMUSCULAR | Status: DC | PRN
  Administered 2021-12-31: 4 mg via INTRAVENOUS

## 2021-12-31 MED ORDER — SUCCINYLCHOLINE CHLORIDE 200 MG/10ML IV SOSY
PREFILLED_SYRINGE | INTRAVENOUS | Status: DC | PRN
  Administered 2021-12-31: 80 mg via INTRAVENOUS

## 2021-12-31 MED ORDER — FOLIC ACID 1 MG PO TABS
1.0000 mg | ORAL_TABLET | Freq: Every day | ORAL | Status: DC
  Administered 2021-12-31 – 2022-01-01 (×2): 1 mg via ORAL
  Filled 2021-12-31 (×2): qty 1

## 2021-12-31 MED ORDER — ROCURONIUM BROMIDE 10 MG/ML (PF) SYRINGE
PREFILLED_SYRINGE | INTRAVENOUS | Status: AC
  Filled 2021-12-31: qty 10

## 2021-12-31 MED ORDER — BUPIVACAINE-EPINEPHRINE (PF) 0.25% -1:200000 IJ SOLN
INTRAMUSCULAR | Status: DC | PRN
  Administered 2021-12-31: 30 mL

## 2021-12-31 MED ORDER — THIAMINE MONONITRATE 100 MG PO TABS
100.0000 mg | ORAL_TABLET | Freq: Every day | ORAL | Status: DC
  Administered 2021-12-31 – 2022-01-01 (×2): 100 mg via ORAL
  Filled 2021-12-31 (×2): qty 1

## 2021-12-31 MED ORDER — KETOROLAC TROMETHAMINE 30 MG/ML IJ SOLN
INTRAMUSCULAR | Status: AC
  Filled 2021-12-31: qty 1

## 2021-12-31 MED ORDER — ONDANSETRON 4 MG PO TBDP: 4.0000 mg | ORAL_TABLET | Freq: Four times a day (QID) | ORAL | Status: DC | PRN

## 2021-12-31 MED ORDER — THIAMINE HCL 100 MG/ML IJ SOLN: 100.0000 mg | Freq: Every day | INTRAMUSCULAR | Status: DC

## 2021-12-31 MED ORDER — ONDANSETRON HCL 4 MG/2ML IJ SOLN
4.0000 mg | Freq: Four times a day (QID) | INTRAMUSCULAR | Status: DC | PRN
  Administered 2021-12-31: 4 mg via INTRAVENOUS
  Filled 2021-12-31: qty 2

## 2021-12-31 MED ORDER — ONDANSETRON 4 MG PO TBDP: 4.0000 mg | ORAL_TABLET | ORAL | Status: DC | PRN

## 2021-12-31 MED ORDER — LIDOCAINE 2% (20 MG/ML) 5 ML SYRINGE
INTRAMUSCULAR | Status: DC | PRN
  Administered 2021-12-31: 60 mg via INTRAVENOUS

## 2021-12-31 MED ORDER — ORAL CARE MOUTH RINSE: 15.0000 mL | Freq: Once | OROMUCOSAL | Status: AC

## 2021-12-31 MED ORDER — CHLORHEXIDINE GLUCONATE 0.12 % MT SOLN
OROMUCOSAL | Status: AC
  Administered 2021-12-31: 15 mL via OROMUCOSAL
  Filled 2021-12-31: qty 15

## 2021-12-31 MED ORDER — IOHEXOL 350 MG/ML SOLN
75.0000 mL | Freq: Once | INTRAVENOUS | Status: AC | PRN
  Administered 2021-12-31: 75 mL via INTRAVENOUS

## 2021-12-31 MED ORDER — OXYCODONE HCL 5 MG PO TABS: 5.0000 mg | ORAL_TABLET | Freq: Once | ORAL | Status: DC | PRN

## 2021-12-31 MED ORDER — SODIUM CHLORIDE 0.9 % IV SOLN
2.0000 g | INTRAVENOUS | Status: AC
  Administered 2021-12-31: 2 g via INTRAVENOUS
  Filled 2021-12-31: qty 2

## 2021-12-31 MED ORDER — ONDANSETRON HCL 4 MG/2ML IJ SOLN
INTRAMUSCULAR | Status: AC
  Filled 2021-12-31: qty 2

## 2021-12-31 MED ORDER — MIDAZOLAM HCL 2 MG/2ML IJ SOLN
INTRAMUSCULAR | Status: AC
  Filled 2021-12-31: qty 2

## 2021-12-31 MED ORDER — FENTANYL CITRATE (PF) 100 MCG/2ML IJ SOLN: 25.0000 ug | INTRAMUSCULAR | Status: DC | PRN

## 2021-12-31 MED ORDER — ACETAMINOPHEN 500 MG PO TABS
1000.0000 mg | ORAL_TABLET | Freq: Four times a day (QID) | ORAL | Status: DC
  Administered 2021-12-31 – 2022-01-01 (×4): 1000 mg via ORAL
  Filled 2021-12-31 (×5): qty 2

## 2021-12-31 MED ORDER — LORAZEPAM 2 MG/ML IJ SOLN
1.0000 mg | INTRAMUSCULAR | Status: DC | PRN
  Administered 2021-12-31 – 2022-01-01 (×2): 1 mg via INTRAVENOUS
  Filled 2021-12-31 (×2): qty 1

## 2021-12-31 MED ORDER — HYDROCODONE-ACETAMINOPHEN 5-325 MG PO TABS: 1.0000 | ORAL_TABLET | ORAL | Status: DC | PRN

## 2021-12-31 MED ORDER — ONDANSETRON HCL 4 MG/2ML IJ SOLN
4.0000 mg | INTRAMUSCULAR | Status: DC | PRN
  Administered 2021-12-31: 4 mg via INTRAVENOUS
  Filled 2021-12-31: qty 2

## 2021-12-31 MED ORDER — KETOROLAC TROMETHAMINE 30 MG/ML IJ SOLN
30.0000 mg | Freq: Once | INTRAMUSCULAR | Status: AC
  Administered 2021-12-31: 30 mg via INTRAVENOUS

## 2021-12-31 MED ORDER — PROPOFOL 10 MG/ML IV BOLUS
INTRAVENOUS | Status: DC | PRN
  Administered 2021-12-31: 150 mg via INTRAVENOUS

## 2021-12-31 MED ORDER — SUGAMMADEX SODIUM 200 MG/2ML IV SOLN
INTRAVENOUS | Status: DC | PRN
  Administered 2021-12-31 (×2): 100 mg via INTRAVENOUS

## 2021-12-31 MED ORDER — SUCCINYLCHOLINE CHLORIDE 200 MG/10ML IV SOSY
PREFILLED_SYRINGE | INTRAVENOUS | Status: AC
  Filled 2021-12-31: qty 10

## 2021-12-31 MED ORDER — OXYCODONE HCL 5 MG PO TABS
5.0000 mg | ORAL_TABLET | ORAL | Status: DC | PRN
  Administered 2021-12-31: 5 mg via ORAL
  Filled 2021-12-31: qty 1

## 2021-12-31 MED ORDER — ADULT MULTIVITAMIN W/MINERALS CH
1.0000 | ORAL_TABLET | Freq: Every day | ORAL | Status: DC
  Administered 2021-12-31 – 2022-01-01 (×2): 1 via ORAL
  Filled 2021-12-31 (×2): qty 1

## 2021-12-31 MED ORDER — DEXAMETHASONE SODIUM PHOSPHATE 10 MG/ML IJ SOLN
INTRAMUSCULAR | Status: AC
  Filled 2021-12-31: qty 1

## 2021-12-31 MED ORDER — FENTANYL CITRATE (PF) 250 MCG/5ML IJ SOLN
INTRAMUSCULAR | Status: AC
  Filled 2021-12-31: qty 5

## 2021-12-31 MED ORDER — BUPIVACAINE-EPINEPHRINE (PF) 0.25% -1:200000 IJ SOLN
INTRAMUSCULAR | Status: AC
  Filled 2021-12-31: qty 30

## 2021-12-31 MED ORDER — CHLORHEXIDINE GLUCONATE 0.12 % MT SOLN: 15.0000 mL | Freq: Once | OROMUCOSAL | Status: AC

## 2021-12-31 MED ORDER — DEXTROSE-NACL 5-0.9 % IV SOLN: INTRAVENOUS | Status: DC

## 2021-12-31 MED ORDER — PROPOFOL 10 MG/ML IV BOLUS
INTRAVENOUS | Status: AC
  Filled 2021-12-31: qty 20

## 2021-12-31 MED ORDER — DEXMEDETOMIDINE HCL IN NACL 80 MCG/20ML IV SOLN
INTRAVENOUS | Status: DC | PRN
  Administered 2021-12-31: 8 ug via BUCCAL

## 2021-12-31 SURGICAL SUPPLY — 27 items
BAG COUNTER SPONGE SURGICOUNT (BAG) ×2 IMPLANT
BLADE CLIPPER SURG (BLADE) ×1 IMPLANT
CANISTER SUCT 3000ML PPV (MISCELLANEOUS) ×2 IMPLANT
CHLORAPREP W/TINT 26 (MISCELLANEOUS) ×2 IMPLANT
COVER SURGICAL LIGHT HANDLE (MISCELLANEOUS) ×2 IMPLANT
DERMABOND ADVANCED .7 DNX12 (GAUZE/BANDAGES/DRESSINGS) ×3 IMPLANT
DRAPE WARM FLUID 44X44 (DRAPES) ×2 IMPLANT
ELECT REM PT RETURN 9FT ADLT (ELECTROSURGICAL) ×2
ELECTRODE REM PT RTRN 9FT ADLT (ELECTROSURGICAL) ×2 IMPLANT
GLOVE BIOGEL PI IND STRL 6 (GLOVE) ×2 IMPLANT
GLOVE BIOGEL PI MICRO STRL 5.5 (GLOVE) ×2 IMPLANT
GOWN STRL REUS W/ TWL LRG LVL3 (GOWN DISPOSABLE) ×4 IMPLANT
GOWN STRL REUS W/TWL LRG LVL3 (GOWN DISPOSABLE) ×4
HANDLE SUCTION POOLE (INSTRUMENTS) ×2 IMPLANT
KIT BASIN OR (CUSTOM PROCEDURE TRAY) ×2 IMPLANT
KIT TURNOVER KIT B (KITS) ×2 IMPLANT
NS IRRIG 1000ML POUR BTL (IV SOLUTION) ×4 IMPLANT
PAD ARMBOARD 7.5X6 YLW CONV (MISCELLANEOUS) ×2 IMPLANT
SLEEVE Z-THREAD 5X100MM (TROCAR) ×2 IMPLANT
SUCTION POOLE HANDLE (INSTRUMENTS) ×2
SUT MNCRL AB 4-0 PS2 18 (SUTURE) ×3 IMPLANT
SUT VIC AB 3-0 SH 27 (SUTURE) ×2
SUT VIC AB 3-0 SH 27XBRD (SUTURE) ×3 IMPLANT
TOWEL GREEN STERILE (TOWEL DISPOSABLE) ×2 IMPLANT
TRAY FOLEY MTR SLVR 16FR STAT (SET/KITS/TRAYS/PACK) ×1 IMPLANT
TRAY LAPAROSCOPIC MC (CUSTOM PROCEDURE TRAY) ×1 IMPLANT
TROCAR Z-THREAD OPTICAL 5X100M (TROCAR) ×1 IMPLANT

## 2021-12-31 NOTE — Anesthesia Preprocedure Evaluation (Addendum)
Anesthesia Evaluation  Patient identified by MRN, date of birth, ID band Patient awake    Reviewed: Allergy & Precautions, NPO status , Patient's Chart, lab work & pertinent test results  Airway Mallampati: III  TM Distance: >3 FB Neck ROM: Full    Dental no notable dental hx.    Pulmonary Current Smoker and Patient abstained from smoking.,    Pulmonary exam normal        Cardiovascular Normal cardiovascular exam     Neuro/Psych negative neurological ROS  negative psych ROS   GI/Hepatic negative GI ROS, (+)     substance abuse  ,   Endo/Other  Hyponatremia   Renal/GU negative Renal ROS     Musculoskeletal negative musculoskeletal ROS (+)   Abdominal   Peds  Hematology  (+) Blood dyscrasia, anemia , Thrombocytopenia    Anesthesia Other Findings COLON PERFORATION  Reproductive/Obstetrics                            Anesthesia Physical Anesthesia Plan  ASA: 2  Anesthesia Plan: General   Post-op Pain Management:    Induction: Intravenous and Rapid sequence  PONV Risk Score and Plan: 1 and Ondansetron, Dexamethasone, Midazolam and Treatment may vary due to age or medical condition  Airway Management Planned: Oral ETT  Additional Equipment:   Intra-op Plan:   Post-operative Plan: Extubation in OR  Informed Consent: I have reviewed the patients History and Physical, chart, labs and discussed the procedure including the risks, benefits and alternatives for the proposed anesthesia with the patient or authorized representative who has indicated his/her understanding and acceptance.     Dental advisory given  Plan Discussed with: CRNA  Anesthesia Plan Comments:        Anesthesia Quick Evaluation

## 2021-12-31 NOTE — Progress Notes (Signed)
Patient remains stable. No abdominal tenderness. CT reviewed and is concerning for hypoperfusion of the right colon with what appears to be extraluminal air. I discussed with the patient and recommended a diagnostic laparoscopy to definitively evaluate the colon and rule out an injury. If a perforation or ischemia is identified, he may require a right colectomy. I also discussed the possibility of conversion to a laparotomy depending on operative findings. He expressed understanding and agrees to proceed with surgery. All questions answered.

## 2021-12-31 NOTE — Anesthesia Procedure Notes (Signed)
Procedure Name: Intubation Date/Time: 12/31/2021 3:45 PM  Performed by: Trinna Post., CRNAPre-anesthesia Checklist: Patient identified, Emergency Drugs available, Suction available, Patient being monitored and Timeout performed Patient Re-evaluated:Patient Re-evaluated prior to induction Oxygen Delivery Method: Circle system utilized Preoxygenation: Pre-oxygenation with 100% oxygen Induction Type: IV induction, Rapid sequence and Cricoid Pressure applied Laryngoscope Size: Mac and 4 Grade View: Grade I Tube type: Oral Tube size: 7.5 mm Number of attempts: 1 Airway Equipment and Method: Stylet Placement Confirmation: ETT inserted through vocal cords under direct vision, positive ETCO2 and breath sounds checked- equal and bilateral Secured at: 22 cm Tube secured with: Tape Dental Injury: Teeth and Oropharynx as per pre-operative assessment
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
## 2021-12-31 NOTE — Progress Notes (Signed)
Patient just see a few hours ago.  Repeat abdominal exam as patient is more awake remains completely benign with no pain, no distention, great BS.  Lactic acid up to 5, but am labs are pending.  Repeat lactic acid and follow abdominal exam.  Drinks everyday, will start CIWA as well.  C/o right ankle pain with significant edema and bruising.  Will x-ray this.  Continue to closely monitor.  Christian Sawyer 8:21 AM 12/31/2021

## 2021-12-31 NOTE — ED Notes (Signed)
Patient transported to CT SCAN . 

## 2021-12-31 NOTE — ED Notes (Signed)
Pt placed on o2 monitor.

## 2021-12-31 NOTE — Transfer of Care (Signed)
Immediate Anesthesia Transfer of Care Note  Patient: [name]  Procedure(s) Performed: DIAGNOSTIC LAPAROSCOPY (Abdomen)  Patient Location: PACU  Anesthesia Type:General  Level of Consciousness: awake, alert  and oriented  Airway & Oxygen Therapy: Patient Spontanous Breathing and Patient connected to nasal cannula oxygen  Post-op Assessment: Report given to RN and Post -op Vital signs reviewed and stable  Post vital signs: Reviewed and stable  Last Vitals:  Vitals Value Taken Time  BP 174/98 12/31/21 1645  Temp 36.7 C 12/31/21 1645  Pulse 91 12/31/21 1652  Resp 19 12/31/21 1652  SpO2 91 % 12/31/21 1652  Vitals shown include unvalidated device data.  Last Pain:  Vitals:   12/31/21 1645  TempSrc:   PainSc: 0-No pain         Complications: No notable events documented.

## 2021-12-31 NOTE — Anesthesia Postprocedure Evaluation (Signed)
Anesthesia Post Note  Patient: Christian Sawyer  Procedure(s) Performed: DIAGNOSTIC LAPAROSCOPY (Abdomen)     Patient location during evaluation: PACU Anesthesia Type: General Level of consciousness: awake Pain management: pain level controlled Vital Signs Assessment: post-procedure vital signs reviewed and stable Respiratory status: spontaneous breathing, nonlabored ventilation, respiratory function stable and patient connected to nasal cannula oxygen Cardiovascular status: blood pressure returned to baseline and stable Postop Assessment: no apparent nausea or vomiting Anesthetic complications: no   No notable events documented.  Last Vitals:  Vitals:   12/31/21 1715 12/31/21 1743  BP: (!) 171/99 (!) 164/102  Pulse: 86 83  Resp: 18 18  Temp: 36.6 C 36.6 C  SpO2: 94% 98%    Last Pain:  Vitals:   12/31/21 1743  TempSrc: Oral  PainSc: 0-No pain                 Terri Rorrer P Eular Panek
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
## 2022-01-01 ENCOUNTER — Encounter (HOSPITAL_COMMUNITY): Payer: Self-pay | Admitting: Surgery

## 2022-01-01 LAB — CBC
HCT: 30.9 % — ABNORMAL LOW (ref 39.0–52.0)
Hemoglobin: 11.4 g/dL — ABNORMAL LOW (ref 13.0–17.0)
MCH: 33.4 pg (ref 26.0–34.0)
MCHC: 36.9 g/dL — ABNORMAL HIGH (ref 30.0–36.0)
MCV: 90.6 fL (ref 80.0–100.0)
Platelets: 117 10*3/uL — ABNORMAL LOW (ref 150–400)
RBC: 3.41 MIL/uL — ABNORMAL LOW (ref 4.22–5.81)
RDW: 12.4 % (ref 11.5–15.5)
WBC: 4.2 10*3/uL (ref 4.0–10.5)
nRBC: 0 % (ref 0.0–0.2)

## 2022-01-01 LAB — BASIC METABOLIC PANEL
Anion gap: 10 (ref 5–15)
BUN: 6 mg/dL (ref 6–20)
CO2: 25 mmol/L (ref 22–32)
Calcium: 8.7 mg/dL — ABNORMAL LOW (ref 8.9–10.3)
Chloride: 93 mmol/L — ABNORMAL LOW (ref 98–111)
Creatinine, Ser: 0.92 mg/dL (ref 0.61–1.24)
GFR, Estimated: >60 mL/min (ref >60)
Glucose, Bld: 134 mg/dL — ABNORMAL HIGH (ref 70–99)
Potassium: 4.1 mmol/L (ref 3.5–5.1)
Sodium: 128 mmol/L — ABNORMAL LOW (ref 135–145)

## 2022-01-01 MED ORDER — SPIRITUS FRUMENTI
1.0000 | Freq: Four times a day (QID) | ORAL | Status: DC
  Filled 2022-01-01 (×2): qty 1

## 2022-01-01 NOTE — Progress Notes (Signed)
RN went downstairs to get pt's beer from pharmacy because it could not be tubed. When RN got back on the floor to enter the room [facility] notified me that the patient left. MD messaged
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
## 2022-11-01 DEATH — deceased
